# Patient Record
Sex: Male | Born: 1990 | Race: White | Hispanic: No | Marital: Single | Smoking: Former smoker
Health system: Southern US, Community
[De-identification: ages and names within clinical notes are randomized; demographics above are authoritative.]

## PROBLEM LIST (undated history)

## (undated) DIAGNOSIS — J852 Abscess of lung without pneumonia: Secondary | ICD-10-CM

## (undated) DIAGNOSIS — F1910 Other psychoactive substance abuse, uncomplicated: Secondary | ICD-10-CM

## (undated) HISTORY — PX: OTHER SURGICAL HISTORY: SHX169

## (undated) HISTORY — DX: Abscess of lung without pneumonia: J85.2

## (undated) HISTORY — DX: Other psychoactive substance abuse, uncomplicated: F19.10

---

## 2011-01-21 ENCOUNTER — Ambulatory Visit (INDEPENDENT_AMBULATORY_CARE_PROVIDER_SITE_OTHER): Payer: 59

## 2011-01-21 ENCOUNTER — Ambulatory Visit (HOSPITAL_COMMUNITY): Admission: RE | Admit: 2011-01-21 | Payer: 59 | Source: Ambulatory Visit

## 2011-01-21 ENCOUNTER — Inpatient Hospital Stay (HOSPITAL_COMMUNITY)
Admission: EM | Admit: 2011-01-21 | Discharge: 2011-01-26 | DRG: 177 | Disposition: A | Discharge status: Home Health | Payer: 59 | Attending: Internal Medicine | Admitting: Internal Medicine

## 2011-01-21 ENCOUNTER — Inpatient Hospital Stay (INDEPENDENT_AMBULATORY_CARE_PROVIDER_SITE_OTHER)
Admission: RE | Admit: 2011-01-21 | Discharge: 2011-01-21 | Disposition: A | Discharge status: Home / Self Care | Payer: 59 | Source: Ambulatory Visit | Attending: Family Medicine | Admitting: Family Medicine

## 2011-01-21 DIAGNOSIS — J189 Pneumonia, unspecified organism: Secondary | ICD-10-CM

## 2011-01-21 DIAGNOSIS — F1210 Cannabis abuse, uncomplicated: Secondary | ICD-10-CM | POA: Diagnosis present

## 2011-01-21 DIAGNOSIS — Z79899 Other long term (current) drug therapy: Secondary | ICD-10-CM

## 2011-01-21 DIAGNOSIS — J852 Abscess of lung without pneumonia: Principal | ICD-10-CM | POA: Diagnosis present

## 2011-01-21 DIAGNOSIS — F17200 Nicotine dependence, unspecified, uncomplicated: Secondary | ICD-10-CM | POA: Diagnosis present

## 2011-01-21 LAB — BASIC METABOLIC PANEL
Chloride: 95 mEq/L — ABNORMAL LOW (ref 96–112)
Creatinine, Ser: 0.76 mg/dL (ref 0.4–1.5)
GFR calc Af Amer: >60 mL/min (ref >60)
Sodium: 133 mEq/L — ABNORMAL LOW (ref 135–145)

## 2011-01-21 LAB — CBC
MCV: 91.4 fL (ref 78.0–100.0)
Platelets: 408 10*3/uL — ABNORMAL HIGH (ref 150–400)
RBC: 3.84 MIL/uL — ABNORMAL LOW (ref 4.22–5.81)
RDW: 12.8 % (ref 11.5–15.5)
WBC: 20.8 10*3/uL — ABNORMAL HIGH (ref 4.0–10.5)

## 2011-01-21 LAB — DIFFERENTIAL
Basophils Absolute: 0 10*3/uL (ref 0.0–0.1)
Eosinophils Absolute: 0.2 10*3/uL (ref 0.0–0.7)
Lymphocytes Relative: 9 % — ABNORMAL LOW (ref 12–46)
Monocytes Absolute: 2.1 10*3/uL — ABNORMAL HIGH (ref 0.1–1.0)
Neutrophils Relative %: 80 % — ABNORMAL HIGH (ref 43–77)

## 2011-01-21 LAB — HEPATIC FUNCTION PANEL
AST: 8 U/L (ref 0–37)
Bilirubin, Direct: 0.1 mg/dL (ref 0.0–0.3)
Indirect Bilirubin: 0.2 mg/dL — ABNORMAL LOW (ref 0.3–0.9)
Total Protein: 6.7 g/dL (ref 6.0–8.3)

## 2011-01-21 LAB — URINALYSIS, ROUTINE W REFLEX MICROSCOPIC
Ketones, ur: NEGATIVE mg/dL
Leukocytes, UA: NEGATIVE
Nitrite: NEGATIVE
Protein, ur: NEGATIVE mg/dL
pH: 7 (ref 5.0–8.0)

## 2011-01-21 LAB — RAPID URINE DRUG SCREEN, HOSP PERFORMED
Benzodiazepines: NOT DETECTED
Cocaine: NOT DETECTED
Opiates: NOT DETECTED

## 2011-01-22 ENCOUNTER — Inpatient Hospital Stay (HOSPITAL_COMMUNITY): Payer: 59

## 2011-01-22 DIAGNOSIS — J852 Abscess of lung without pneumonia: Secondary | ICD-10-CM

## 2011-01-22 LAB — EXPECTORATED SPUTUM ASSESSMENT W GRAM STAIN, RFLX TO RESP C

## 2011-01-22 LAB — BASIC METABOLIC PANEL
BUN: 7 mg/dL (ref 6–23)
CO2: 30 mEq/L (ref 19–32)
Calcium: 8.5 mg/dL (ref 8.4–10.5)
Creatinine, Ser: 0.66 mg/dL (ref 0.4–1.5)
Glucose, Bld: 104 mg/dL — ABNORMAL HIGH (ref 70–99)

## 2011-01-22 LAB — POCT I-STAT, CHEM 8
Calcium, Ion: 1.08 mmol/L — ABNORMAL LOW (ref 1.12–1.32)
HCT: 39 % (ref 39.0–52.0)
Hemoglobin: 13.3 g/dL (ref 13.0–17.0)
Sodium: 134 mEq/L — ABNORMAL LOW (ref 135–145)
TCO2: 28 mmol/L (ref 0–100)

## 2011-01-22 LAB — CREATININE, SERUM
Creatinine, Ser: 0.79 mg/dL (ref 0.4–1.5)
GFR calc non Af Amer: >60 mL/min (ref >60)

## 2011-01-22 LAB — CBC
Hemoglobin: 11.9 g/dL — ABNORMAL LOW (ref 13.0–17.0)
MCH: 31.6 pg (ref 26.0–34.0)
MCV: 93.1 fL (ref 78.0–100.0)
RBC: 3.76 MIL/uL — ABNORMAL LOW (ref 4.22–5.81)

## 2011-01-22 MED ORDER — IOHEXOL 300 MG/ML  SOLN
80.0000 mL | Freq: Once | INTRAMUSCULAR | Status: AC | PRN
  Administered 2011-01-22: 80 mL via INTRAVENOUS

## 2011-01-23 DIAGNOSIS — J189 Pneumonia, unspecified organism: Secondary | ICD-10-CM

## 2011-01-23 DIAGNOSIS — J852 Abscess of lung without pneumonia: Secondary | ICD-10-CM

## 2011-01-23 LAB — CBC
MCH: 31.2 pg (ref 26.0–34.0)
MCHC: 33.5 g/dL (ref 30.0–36.0)
Platelets: 358 10*3/uL (ref 150–400)
RBC: 3.65 MIL/uL — ABNORMAL LOW (ref 4.22–5.81)

## 2011-01-23 LAB — CD4/CD8 (T-HELPER/T-SUPPRESSOR CELL)
CD8 T Cell Abs: 240 /uL (ref 230–1000)
Total lymphocyte count: 1470 /uL (ref 1000–4000)

## 2011-01-23 LAB — HEPATITIS PANEL, ACUTE
HCV Ab: NEGATIVE
Hep B C IgM: NEGATIVE
Hepatitis B Surface Ag: NEGATIVE

## 2011-01-23 LAB — BASIC METABOLIC PANEL
CO2: 28 mEq/L (ref 19–32)
Calcium: 8.6 mg/dL (ref 8.4–10.5)
GFR calc Af Amer: >60 mL/min (ref >60)
GFR calc non Af Amer: >60 mL/min (ref >60)
Sodium: 136 mEq/L (ref 135–145)

## 2011-01-23 LAB — IGG, IGA, IGM: IgM, Serum: 136 mg/dL (ref 41–251)

## 2011-01-24 ENCOUNTER — Inpatient Hospital Stay (HOSPITAL_COMMUNITY): Payer: 59

## 2011-01-24 DIAGNOSIS — J852 Abscess of lung without pneumonia: Secondary | ICD-10-CM

## 2011-01-24 LAB — CULTURE, RESPIRATORY W GRAM STAIN: Culture: NORMAL

## 2011-01-24 LAB — CBC
HCT: 34.2 % — ABNORMAL LOW (ref 39.0–52.0)
MCH: 31.3 pg (ref 26.0–34.0)
MCHC: 33.9 g/dL (ref 30.0–36.0)
RDW: 13 % (ref 11.5–15.5)

## 2011-01-24 LAB — BASIC METABOLIC PANEL
BUN: 6 mg/dL (ref 6–23)
Calcium: 8.6 mg/dL (ref 8.4–10.5)
GFR calc Af Amer: >60 mL/min (ref >60)
GFR calc non Af Amer: >60 mL/min (ref >60)
Potassium: 3.6 mEq/L (ref 3.5–5.1)

## 2011-01-24 LAB — ASPERGILLUS GALACTOMANNAN ANTIGEN: Aspergillus galactomannan Index: 0.1 (ref <0.5)

## 2011-01-24 NOTE — Consult Note (Signed)
  Gregory Flowers, Gregory Flowers NO.:  [id number]  MEDICAL RECORD NO.:  [id number]  LOCATION:  5114                         FACILITY:  [facility]  PHYSICIAN:  Ines Bloomer, M.D. DATE OF BIRTH:  1991-02-26  DATE OF CONSULTATION: DATE OF DISCHARGE:                                CONSULTATION   CHIEF COMPLAINT:  Fever.  HISTORY OF PRESENT ILLNESS:  This 20 year old [place] the beach.  He has history of multiple urine and sinus infections and was treated at an Outpatient Clinic for right lower lobe pneumonia.  He has temp of 103 and yellow sputum in cough.  He was admitted to the hospital because of progression in his temperature with chronic coughing.  PAST MEDICAL HISTORY:  Significant for extensive sinus infection, otitis media as a child.  ALLERGIES:  None.  MEDICATIONS:  None.  His white count on admission was 20,000.  FAMILY HISTORY:  Noncontributory.  SOCIAL HISTORY:  He apparently does use marijuana and smokes.  REVIEW OF SYSTEMS:  As reported in the chart.  PHYSICAL EXAMINATION:  GENERAL:  He is well-developed male with temperature of 101, blood pressure is 110/60, pulse is 100, sats are 95%. HEAD, EYES, EARS, NOSE, AND THROAT:  Unremarkable. NECK:  Supple without thyromegaly.  No rigidity. CHEST:  Decreased breath sounds on the right. HEART:  Regular sinus rhythm.  No murmurs. ABDOMEN:  Soft. EXTREMITIES:  Pulses are 2+.  There is no clubbing or edema. NEUROLOGIC:  He is oriented x3.  Sensory and motor intact.  Cranial nerves intact.  IMPRESSION: 1. Right lower lobe pneumonia with right lower lobe abscess. 2. History of multiple ear infections.  PLAN:  Serial chest x-rays, antibiotics.  He may need a bronchoscopy to obtain good culture.     Ines Bloomer, M.D.     DPB/MEDQ  D:  01/23/2011  T:  01/24/2011  Job:  [id number]  Electronically Signed by Jovita Gamma M.D. on 01/24/2011 04:56:54 PM

## 2011-01-25 ENCOUNTER — Inpatient Hospital Stay (HOSPITAL_COMMUNITY): Payer: 59

## 2011-01-25 LAB — BASIC METABOLIC PANEL
BUN: 10 mg/dL (ref 6–23)
CO2: 31 mEq/L (ref 19–32)
Chloride: 100 mEq/L (ref 96–112)
Glucose, Bld: 102 mg/dL — ABNORMAL HIGH (ref 70–99)
Potassium: 3.8 mEq/L (ref 3.5–5.1)

## 2011-01-25 LAB — CBC
HCT: 32.4 % — ABNORMAL LOW (ref 39.0–52.0)
Hemoglobin: 10.7 g/dL — ABNORMAL LOW (ref 13.0–17.0)
MCHC: 33 g/dL (ref 30.0–36.0)
RBC: 3.51 MIL/uL — ABNORMAL LOW (ref 4.22–5.81)
WBC: 14 10*3/uL — ABNORMAL HIGH (ref 4.0–10.5)

## 2011-01-25 LAB — VANCOMYCIN, TROUGH: Vancomycin Tr: 18.9 ug/mL (ref 10.0–20.0)

## 2011-01-26 LAB — BASIC METABOLIC PANEL
BUN: 8 mg/dL (ref 6–23)
Calcium: 8.7 mg/dL (ref 8.4–10.5)
GFR calc Af Amer: >60 mL/min (ref >60)
GFR calc non Af Amer: >60 mL/min (ref >60)
Potassium: 3.9 mEq/L (ref 3.5–5.1)
Sodium: 138 mEq/L (ref 135–145)

## 2011-01-26 LAB — CBC
MCH: 30.5 pg (ref 26.0–34.0)
MCHC: 32.8 g/dL (ref 30.0–36.0)
RDW: 12.9 % (ref 11.5–15.5)

## 2011-01-27 LAB — CULTURE, BLOOD (ROUTINE X 2)
Culture  Setup Time: 201206101651
Culture: NO GROWTH
Culture: NO GROWTH

## 2011-01-30 ENCOUNTER — Telehealth: Payer: Self-pay | Admitting: Licensed Clinical Social Worker

## 2011-01-30 ENCOUNTER — Other Ambulatory Visit: Payer: Self-pay | Admitting: Licensed Clinical Social Worker

## 2011-01-30 ENCOUNTER — Other Ambulatory Visit (INDEPENDENT_AMBULATORY_CARE_PROVIDER_SITE_OTHER): Payer: 59

## 2011-01-30 ENCOUNTER — Ambulatory Visit (HOSPITAL_COMMUNITY)
Admission: RE | Admit: 2011-01-30 | Discharge: 2011-01-30 | Disposition: A | Discharge status: Home / Self Care | Payer: 59 | Source: Ambulatory Visit | Attending: Infectious Disease | Admitting: Infectious Disease

## 2011-01-30 ENCOUNTER — Emergency Department (HOSPITAL_COMMUNITY)
Admission: EM | Admit: 2011-01-30 | Discharge: 2011-01-30 | Disposition: A | Discharge status: Home / Self Care | Payer: 59 | Attending: Emergency Medicine | Admitting: Emergency Medicine

## 2011-01-30 DIAGNOSIS — R002 Palpitations: Secondary | ICD-10-CM | POA: Insufficient documentation

## 2011-01-30 DIAGNOSIS — R509 Fever, unspecified: Secondary | ICD-10-CM

## 2011-01-30 DIAGNOSIS — J189 Pneumonia, unspecified organism: Secondary | ICD-10-CM

## 2011-01-30 DIAGNOSIS — R42 Dizziness and giddiness: Secondary | ICD-10-CM | POA: Insufficient documentation

## 2011-01-30 NOTE — Telephone Encounter (Signed)
Patient walked in today for blood cultures, patient started to feel light headed and dizzy along with vomiting as he was getting blood drawn. He became flushed and later turned very pale. Cold bath cloth was applied and he was placed in a comfortable position, he verbalized that he felt better, his blood pressure was taken it was 91/59 with pulse of 192. EMS was called but later cancelled because his blood pressure stabilized at 91/61 pulse 99. His mother that works at the hospital took him to the ER.

## 2011-01-30 NOTE — Telephone Encounter (Signed)
Patient's mother called stating the patient had a temp of 100.9 last night and it is currently 99.8. He is being treated with iv vanc and invanz. Spoke with Dr. Daiva Eves he would like the patient to come to the office and have blood cultures x2 and a chest xray. Patient aware and orders are in the  Computer.

## 2011-02-01 ENCOUNTER — Emergency Department (HOSPITAL_COMMUNITY): Payer: 59

## 2011-02-01 ENCOUNTER — Telehealth: Payer: Self-pay | Admitting: *Deleted

## 2011-02-01 ENCOUNTER — Emergency Department (HOSPITAL_COMMUNITY)
Admission: EM | Admit: 2011-02-01 | Discharge: 2011-02-01 | Disposition: A | Discharge status: Home / Self Care | Payer: 59 | Attending: Emergency Medicine | Admitting: Emergency Medicine

## 2011-02-01 DIAGNOSIS — R61 Generalized hyperhidrosis: Secondary | ICD-10-CM | POA: Insufficient documentation

## 2011-02-01 DIAGNOSIS — R509 Fever, unspecified: Secondary | ICD-10-CM | POA: Insufficient documentation

## 2011-02-01 DIAGNOSIS — Z79899 Other long term (current) drug therapy: Secondary | ICD-10-CM | POA: Insufficient documentation

## 2011-02-01 LAB — DIFFERENTIAL
Basophils Absolute: 0 10*3/uL (ref 0.0–0.1)
Basophils Relative: 0 % (ref 0–1)
Eosinophils Relative: 4 % (ref 0–5)
Monocytes Absolute: 0.3 10*3/uL (ref 0.1–1.0)

## 2011-02-01 LAB — CBC
HCT: 36 % — ABNORMAL LOW (ref 39.0–52.0)
MCHC: 33.3 g/dL (ref 30.0–36.0)
RDW: 13.1 % (ref 11.5–15.5)

## 2011-02-01 LAB — BASIC METABOLIC PANEL
BUN: 10 mg/dL (ref 6–23)
Calcium: 8.8 mg/dL (ref 8.4–10.5)
GFR calc Af Amer: >60 mL/min (ref >60)
GFR calc non Af Amer: >60 mL/min (ref >60)
Potassium: 3.7 mEq/L (ref 3.5–5.1)
Sodium: 135 mEq/L (ref 135–145)

## 2011-02-01 LAB — URINALYSIS, ROUTINE W REFLEX MICROSCOPIC
Bilirubin Urine: NEGATIVE
Ketones, ur: NEGATIVE mg/dL
Nitrite: NEGATIVE
Urobilinogen, UA: 0.2 mg/dL (ref 0.0–1.0)

## 2011-02-01 NOTE — Telephone Encounter (Signed)
States she will be out of vanc in 2 days & he is to be on it longer than that . Says the [facility] nurse comes out every Monday. Mom administers it. I will call [facility] & figure this out & call her back.

## 2011-02-01 NOTE — Telephone Encounter (Signed)
Patient's mother called today stating that her son has a fever on 103 with a bp of 112/53 pulse 112. Spoke with Dr. Zenaida Niece he agrees patient should go to the ER. Patient's mom would like a direct admit to [facility] on telemetry. Per the mother his heart rate has been very high over the past several days.

## 2011-02-02 NOTE — Progress Notes (Signed)
Subjective:    Patient ID: Gregory Flowers, male    DOB: July 18, 1991, 20 y.o.   MRN: [id number]  HPI  Gregory Flowers is a 20--year-old Caucasian male who was admitted to th b service earlier in June after he had failed oral antibiotics for what was thought to be bronchitis with ciprofloxacin and high-dose levofloxacin. He was found to have a cavitary right lower lobe concerning for a lung abscess. Cardiothoracic surgery were consulted and Dr. Edwyna Shell  did see the patient. He was changed to intravenous antibiotics in the form of vancomycin and Invanz. Patient was also seen by my partner Dr. Ninetta Lights. The patient initially improved on IV antibiotics alone it was felt that he would likely continue to improve with continued intravenous antibiotics. He was then discharged to home on vancomycin and her intravenous Invanz. Since then he has had troubles with high grade fevers up to 103. He's had nausea and malaise. He we have had multiple calls from his home health the knee and the patient has been seen twice in the ID clinic to check his IV and 2 to check surveillance blood cultures. On one occasion approximately a week ago he was tachycardic in the 190s and was sent from the clinic to the emergency department at that point time he was also felt to be an pale and uncomfortable appearing. He was seen ER where blood work was done repeat chest x-ray was done repeat chest x-ray shows stability of his infiltrate. He was then discharged to home. He again complained of high grade fevers and nausea on Friday and was referred by her clinic to the emergency room. He again seen in the emergency department again had blood work done blood cultures done which have been sterile so far. Chest x-ray shows stability of the lung abscess. The patient has wondered whether or not his fevers could be due to vancomycin one of the antibiotics he is receiving he does have flushing and what sound like the red man syndrome with vancomycin at times  feels feverish with it although his maximum temperature occurs in the middle of the day between and vancomycin doses. He has as mentioned other symptoms of nausea malaise and sweats this morning. Patient came in today for followup visit. As mentioned he had profuse sweating this morning but was without fever. Has felt slightly better in the last 24 hours. I have been concerned that he may have worsening of his lung abscess and that he needs imaging with a CT scan to better define this and see whether or not he may need intervention of Dr. Edwyna Shell. I will therefore bring him into the hospital electively to get a CT scan of the lungs and to facilitate a prompt cardiothoracic surgery consultation. In all I spent greater than 45 minutes with the patient including greater than 50% of time in counseling the patient and coordinating his care.  Review of Systems  Constitutional: Positive for fever, chills, diaphoresis and fatigue.  HENT: Negative for facial swelling and neck pain.   Eyes: Negative for photophobia and visual disturbance.  Respiratory: Positive for cough. Negative for chest tightness and wheezing.   Cardiovascular: Negative for chest pain, palpitations and leg swelling.  Gastrointestinal: Positive for nausea and vomiting. Negative for abdominal pain, diarrhea and abdominal distention.  Genitourinary: Negative for dysuria and flank pain.  Musculoskeletal: Negative for myalgias, back pain, arthralgias and gait problem.  Skin: Negative for color change and pallor.  Neurological: Negative for dizziness, facial asymmetry, light-headedness and headaches.  Hematological: Negative for adenopathy. Does not bruise/bleed easily.  Psychiatric/Behavioral: Negative for behavioral problems and agitation.       Objective:   Physical Exam  Constitutional: No distress.  HENT:  Head: Normocephalic and atraumatic.  Nose: Nose normal.  Mouth/Throat: Oropharynx is clear and moist. No oropharyngeal exudate.    Eyes: Conjunctivae and EOM are normal. Pupils are equal, round, and reactive to light. Right eye exhibits no discharge. No scleral icterus.  Neck: Normal range of motion. No JVD present.  Cardiovascular: Normal rate, regular rhythm and normal heart sounds.  Exam reveals no gallop and no friction rub.   No murmur heard. Pulmonary/Chest: No accessory muscle usage. Not tachypneic and not bradypneic. No respiratory distress. He has decreased breath sounds in the right lower field. He has no wheezes.       Egophany in right lung base  Lymphadenopathy:    He has no cervical adenopathy.  Skin: He is not diaphoretic.          Assessment & Plan:  Fever I am concerned that this fever is due to a lung abscess that is in need of intervention from a cardiothoracic surgeon. His blood cultures have been clean and his PICC line does not appear grossly infected. Certainly a drug fever is a possibility either due to vancomycin or due to beta-lactam. However I would first want to make sure that the and lung abscess worsening is not the issue and that is of greater concern at this point in time. Should his lung abscess appear to have improved one could consider changing his antibiotics to for example tigecycline which would cover methicillin-resistant staph aureus strep coccus species gram negatives and anaerobes and provide similar coverage to the vancomycin and ertapenem he is currently receiving.  Lung abscess Again I worry that the patient may have worsening of his lung abscess given his systemic symptoms of fever malaise diaphoresis and nausea. I will be admitted to the hospital and get a CT scan and also have him seen by Dr. Edwyna Shell.  Tachycardia Patient became tachycardic when we checked his pulse oximetry level today we think is likely due to stress we will put him on telemetry while he isan inpatient.

## 2011-02-05 ENCOUNTER — Encounter: Payer: Self-pay | Admitting: Infectious Disease

## 2011-02-05 ENCOUNTER — Inpatient Hospital Stay (HOSPITAL_COMMUNITY)
Admission: AD | Admit: 2011-02-05 | Discharge: 2011-02-06 | DRG: 179 | Disposition: A | Discharge status: Home / Self Care | Payer: 59 | Source: Ambulatory Visit | Attending: Internal Medicine | Admitting: Internal Medicine

## 2011-02-05 ENCOUNTER — Ambulatory Visit (INDEPENDENT_AMBULATORY_CARE_PROVIDER_SITE_OTHER): Payer: 59 | Admitting: Infectious Disease

## 2011-02-05 ENCOUNTER — Inpatient Hospital Stay (HOSPITAL_COMMUNITY): Payer: 59

## 2011-02-05 ENCOUNTER — Encounter: Payer: Self-pay | Admitting: Internal Medicine

## 2011-02-05 DIAGNOSIS — J852 Abscess of lung without pneumonia: Secondary | ICD-10-CM

## 2011-02-05 DIAGNOSIS — R509 Fever, unspecified: Secondary | ICD-10-CM | POA: Insufficient documentation

## 2011-02-05 DIAGNOSIS — R000 Tachycardia, unspecified: Secondary | ICD-10-CM | POA: Insufficient documentation

## 2011-02-05 DIAGNOSIS — I10 Essential (primary) hypertension: Secondary | ICD-10-CM | POA: Diagnosis present

## 2011-02-05 DIAGNOSIS — D649 Anemia, unspecified: Secondary | ICD-10-CM | POA: Diagnosis present

## 2011-02-05 LAB — COMPREHENSIVE METABOLIC PANEL
ALT: 45 U/L (ref 0–53)
AST: 37 U/L (ref 0–37)
Albumin: 2.5 g/dL — ABNORMAL LOW (ref 3.5–5.2)
Calcium: 8 mg/dL — ABNORMAL LOW (ref 8.4–10.5)
Creatinine, Ser: 0.72 mg/dL (ref 0.50–1.35)
Sodium: 135 mEq/L (ref 135–145)
Total Protein: 6.7 g/dL (ref 6.0–8.3)

## 2011-02-05 LAB — CBC
MCH: 30.5 pg (ref 26.0–34.0)
MCHC: 34.3 g/dL (ref 30.0–36.0)
Platelets: 144 10*3/uL — ABNORMAL LOW (ref 150–400)
RBC: 3.61 MIL/uL — ABNORMAL LOW (ref 4.22–5.81)
RDW: 13 % (ref 11.5–15.5)

## 2011-02-05 LAB — VANCOMYCIN, TROUGH: Vancomycin Tr: 9.9 ug/mL — ABNORMAL LOW (ref 10.0–20.0)

## 2011-02-05 MED ORDER — IOHEXOL 300 MG/ML  SOLN
80.0000 mL | Freq: Once | INTRAMUSCULAR | Status: AC | PRN
  Administered 2011-02-05: 80 mL via INTRAVENOUS

## 2011-02-05 NOTE — H&P (Deleted)
Hospital Admission Note Date: 02/05/2011  Patient name: Gregory Flowers Medical record number: [id number] Date of birth: 09/24/1990 Age: 20 y.o. Gender: male PCP: No primary provider on file.  Medical Service:  Attending physician:    Dr. Blanch Media Resident [phone]):    Dr. Eben Burow [phone] Resident (R1):    Dr. Anselm Jungling [phone]  Chief Complaint: persistent fever  History of Present Illness: Gregory Flowers is a 60--year-old Caucasian male who was recently admitted in June after he had failed oral antibiotics for what was thought to be bronchitis with ciprofloxacin and high-dose levofloxacin. He was found to have a cavitary right lower lobe concerning for a lung abscess. Cardiothoracic surgery were consulted and Dr. Edwyna Shell did see the patient. He was changed to intravenous antibiotics in the form of vancomycin and Invanz. The patient initially improved on IV antibiotics and was discharged to home on vancomycin and Invanz. Since then he has had high grade fevers up to 103 associated with nausea and malaise. He went to ED approximately one week ago for same concerns and was found to be tachycardic in 190's and repeat CXR showed stability of the infiltrate. He was discharged home but continued to experience the high grade fevers. Blood work and cultures were collected and have been negative to date. Pt describes fevers as intermittent and associated with flushing, malaise and nausea, sweats.  MEDICATIONS:   1. Acetaminophen 325 mg p.o. q.6 p.r.n.   2. Vicodin 5/325 mg 1 tablet p.o. q.4 p.r.n.   3. Ertapenem 1 g IV daily.   4. Vancomycin 1250 mg IV q.8 hours.   Allergies: Amoxicillin and Augmentin  PAST MEDICAL HISTORY: 1. Lung Abscess - recent admission on 01/2011 2. Substance abuse - THC, tobacco  History   Social History  . Marital Status: Single   Social History Main Topics  . Smoking status: Former Games developer  . Alcohol Use: Denies  . Drug Use: Denies   Review of  Systems:  Constitutional: Positive for fever, chills, diaphoresis and fatigue.  HENT: Negative for facial swelling and neck pain.  Eyes: Negative for photophobia and visual disturbance.  Respiratory: Positive for cough. Negative for chest tightness and wheezing.  Cardiovascular: Negative for chest pain, palpitations and leg swelling.  Gastrointestinal: Positive for nausea and vomiting. Negative for abdominal pain, diarrhea and abdominal distention.  Genitourinary: Negative for dysuria and flank pain.  Musculoskeletal: Negative for myalgias, back pain, arthralgias and gait problem.  Skin: Negative for color change and pallor.  Neurological: Negative for dizziness, facial asymmetry, light-headedness and headaches.  Hematological: Negative for adenopathy. Does not bruise/bleed easily.  Psychiatric/Behavioral: Negative for behavioral problems and agitation.   Physical Exam:  Constitutional: No distress, follows commands appropriately Head: Normocephalic and atraumatic.  Nose: Nose normal.  Mouth/Throat: Oropharynx is clear and moist. No oropharyngeal exudate.  Eyes: Conjunctivae and EOM are normal. Pupils are equal, round, and reactive to light. Right eye exhibits no discharge. No scleral icterus.  Neck: Normal range of motion. No JVD present. Supple. Cardiovascular: Normal rate, regular rhythm and normal heart sounds. Exam reveals no gallop and no friction rub. No murmur heard.  Pulmonary/Chest: No accessory muscle usage. Not tachypneic and not bradypneic. No respiratory distress. He has decreased breath sounds in the right lower field. He has no wheezes.  Egophany in right lung base  Lymphadenopathy: He has no cervical adenopathy.  Skin: He is not diaphoretic.   Lab results: pending  Imaging results: pending  Assessment & Plan by Problem:  1) Persistent fevers -concerning  for worsening lung abscess vs drug related fevers. There may be a need from a cardiothoracic surgeon. Plan is to  obtain CT of the chest with contrast and if the abscess appears to be improvingantibiotics can be switched and perhaps consider tigecycline. PLAN: - admit to telemetry and monitor vitals - initiate NS at 100 cc/hr and encourage PO intake - obtain CT of the chest with contrast to evaluate abscess - continue abx vancomycin and ertapenem and readjust the regimen as indicated based on CT chest - consider cardiothoracic surgery if abscess worsening  2) Tachycardia - mostly secondary to problem #1 PLAN: - monitor on telemetry - initiate IV fluids and encourage PO intake - no need for blood cultures, since obtained several days ago and so far negative - follow up on admission labs  3) DVT prophylaxis - heparin 5000 units Q8 hours  [employer]

## 2011-02-05 NOTE — H&P (Signed)
error 

## 2011-02-05 NOTE — Assessment & Plan Note (Signed)
Again I worry that the patient may have worsening of his lung abscess given his systemic symptoms of fever malaise diaphoresis and nausea. I will be admitted to the hospital and get a CT scan and also have him seen by Dr. Edwyna Shell.

## 2011-02-05 NOTE — Assessment & Plan Note (Signed)
I am concerned that this fever is due to a lung abscess that is in need of intervention from a cardiothoracic surgeon. His blood cultures have been clean and his PICC line does not appear grossly infected. Certainly a drug fever is a possibility either due to vancomycin or due to beta-lactam. However I would first want to make sure that the and lung abscess worsening is not the issue and that is of greater concern at this point in time. Should his lung abscess appear to have improved one could consider changing his antibiotics to for example tigecycline which would cover methicillin-resistant staph aureus strep coccus species gram negatives and anaerobes and provide similar coverage to the vancomycin and ertapenem he is currently receiving.

## 2011-02-05 NOTE — Assessment & Plan Note (Signed)
Patient became tachycardic when we checked his pulse oximetry level today we think is likely due to stress we will put him on telemetry while he isan inpatient.

## 2011-02-06 DIAGNOSIS — J852 Abscess of lung without pneumonia: Secondary | ICD-10-CM

## 2011-02-06 LAB — FERRITIN: Ferritin: 590 ng/mL — ABNORMAL HIGH (ref 22–322)

## 2011-02-06 LAB — CBC
HCT: 32.9 % — ABNORMAL LOW (ref 39.0–52.0)
MCV: 89.2 fL (ref 78.0–100.0)
RBC: 3.69 MIL/uL — ABNORMAL LOW (ref 4.22–5.81)
WBC: 3.1 10*3/uL — ABNORMAL LOW (ref 4.0–10.5)

## 2011-02-06 LAB — IRON AND TIBC
Iron: 33 ug/dL — ABNORMAL LOW (ref 42–135)
Saturation Ratios: 18 % — ABNORMAL LOW (ref 20–55)
UIBC: 152 ug/dL

## 2011-02-06 LAB — BASIC METABOLIC PANEL
BUN: 6 mg/dL (ref 6–23)
CO2: 26 mEq/L (ref 19–32)
Chloride: 102 mEq/L (ref 96–112)
Creatinine, Ser: 0.69 mg/dL (ref 0.50–1.35)
GFR calc Af Amer: >60 mL/min (ref >60)
Potassium: 4 mEq/L (ref 3.5–5.1)

## 2011-02-06 LAB — FOLATE: Folate: 10.8 ng/mL

## 2011-02-08 LAB — CULTURE, BLOOD (ROUTINE X 2)
Culture  Setup Time: 201206220218
Culture: NO GROWTH

## 2011-02-12 ENCOUNTER — Telehealth: Payer: Self-pay | Admitting: *Deleted

## 2011-02-12 NOTE — Telephone Encounter (Signed)
Mom states he has a red rash over "85% of his body". Does not itch. Does not look like blisters. He is taking benadryl. appt made for tomorrow at [facility]

## 2011-02-13 ENCOUNTER — Encounter: Payer: Self-pay | Admitting: Adult Health

## 2011-02-13 ENCOUNTER — Ambulatory Visit (INDEPENDENT_AMBULATORY_CARE_PROVIDER_SITE_OTHER): Payer: 59 | Admitting: Adult Health

## 2011-02-13 DIAGNOSIS — J852 Abscess of lung without pneumonia: Secondary | ICD-10-CM

## 2011-02-13 DIAGNOSIS — Z888 Allergy status to other drugs, medicaments and biological substances status: Secondary | ICD-10-CM

## 2011-02-13 DIAGNOSIS — T7840XA Allergy, unspecified, initial encounter: Secondary | ICD-10-CM

## 2011-02-13 DIAGNOSIS — R110 Nausea: Secondary | ICD-10-CM | POA: Insufficient documentation

## 2011-02-13 MED ORDER — METHYLPREDNISOLONE 4 MG PO KIT: PACK | ORAL | Status: AC

## 2011-02-13 MED ORDER — ONDANSETRON HCL 4 MG PO TABS: 4.0000 mg | ORAL_TABLET | Freq: Three times a day (TID) | ORAL | Status: AC | PRN

## 2011-02-13 NOTE — Progress Notes (Signed)
  Subjective:    Patient ID: Gregory Flowers, male    DOB: 1991/03/11, 20 y.o.   MRN: [id number]  HPI 20 year old, white male with a history of lung abscess and currently, on IV vancomycin and Invanz presents with a four-day onset of a diffuse urticaria, rash over most body areas, including, inguinal areas. Legs, arms, abdomen, and back. Has been on IV Vanco and Invanz for a little over 2 weeks and is currently scheduled for an additional 2 weeks. Therapy. Rash. Currently described as pruritic in nature, red, and has a sunburn-like sensation. Denies any new onset of fevers, chills, or sweats. Has had a considerable amount of weight loss during his current course of illness. Denies any dysphagia, odynophagia, or oral sores.   Review of Systems  Constitutional: Positive for fatigue and unexpected weight change. Negative for fever, chills, diaphoresis, activity change and appetite change.  HENT: Negative for sore throat, mouth sores and trouble swallowing.   Respiratory: Positive for shortness of breath. Negative for cough, wheezing and stridor.   Cardiovascular: Negative for chest pain, palpitations and leg swelling.  Gastrointestinal: Positive for nausea. Negative for vomiting and diarrhea.  Genitourinary: Negative.   Musculoskeletal: Negative.   Skin: Positive for rash.  Neurological: Negative.   Hematological: Negative.        Objective:   Physical Exam  Constitutional: He is oriented to person, place, and time. He appears well-developed.       Underweight-appearing with a BMI of 16.26  HENT:  Head: Normocephalic and atraumatic.  Eyes: Conjunctivae and EOM are normal. Pupils are equal, round, and reactive to light.  Cardiovascular: Normal rate and regular rhythm.   Musculoskeletal: Normal range of motion.  Neurological: He is alert and oriented to person, place, and time. No cranial nerve deficit. He exhibits normal muscle tone. Coordination normal.  Skin:       For diffuse  urticaria/morbilliform rash noted to all body areas, except face, and neck.  Psychiatric: He has a normal mood and affect. His behavior is normal. Judgment and thought content normal.          Assessment & Plan:  1. Allergic, drug reaction. Most likely associated with current antibiotics, Invanz > vancomycin. In discussion with Dr. Algis Liming, we have opted to discontinue both vancomycin and Invanz, and begin Tygacil 100 mg IV piggyback x1 then 50 mg IV every 12 hours through 02/25/2011. We will also give him a Medrol Dosepak and for his nausea. We will give him ondansetron. 4 mg by mouth every 8 hours when necessary. Family instructed to give him ondansetron. 30 minutes before his Tygacil. They were also instructed to contact the clinic should the rash. Not improve or new symptoms develop. He is scheduled to followup with Dr. Algis Liming on 02/26/2011, and he is to keep that appointment.  Verbally acknowledged all information that was provided to him and agreed with plan of care.

## 2011-02-16 NOTE — Discharge Summary (Signed)
NAMENOELL, SHULAR NO.:  [id number]  MEDICAL RECORD NO.:  [id number]  LOCATION:  5114                         FACILITY:  [facility]  PHYSICIAN:  Ileana Roup, M.D.  DATE OF BIRTH:  May 28, 1991  DATE OF ADMISSION:  01/21/2011 DATE OF DISCHARGE:  01/26/2011                              DISCHARGE SUMMARY   PRIMARY CARE PHYSICIAN:  Della Goo, MD, [place], [place], Outpatient ID clinic at [facility].  DISCHARGE DIAGNOSES: 1. Lung abscess.  The patient is being on IV antibiotics for 6 days     and will go home with PICC line for continuing IV therapy at home. 2. Substance abuse.  The patient is counseled on cessation of daily     marijuana use as well as pack a day cigarette smoking.  DISCHARGE MEDICATIONS: 1. Acetaminophen 325 mg p.o. q.6 p.r.n. 2. Vicodin 5/325 mg 1 tablet p.o. q.4 p.r.n. 3. Ertapenem 1 g IV daily. 4. Vancomycin 1250 mg IV q.8 hours. 5. Also stop taking ibuprofen due to potential nephrotoxicity and     combination with vancomycin.  DISPOSITION AND FOLLOWUP:  The patient will follow up with Dr. Della Goo at [facility], [facility] on February 07, 2011, at 10:30.  This appointment is for the patient to establish primary care physician and for disposition, followup on the recent lung abscess in hospitalization. The patient will also follow up with [facility] on February 05, 2011, at 11:30 a.m. for follow up on chest x-ray vancomycin trough and response outpatient IV antibiotics therapy.  Lastly, the patient will follow up with Dr. Jovita Gamma, thoracic surgeon for followup after recent hospitalization.  PROCEDURES:  The chest x-ray on January 21, 2011, showed right lower lobe pneumonia with small right parapneumonic effusion.  A follow up chest x- ray on January 22, 2011, showed right lower lobe pneumonia, partial interval clearing.  It also demonstrated cavitary component  and an air fluid level, which was more prominent since there was a prior study, which prompted a CT scan of the chest with contrast in which a cavitary lesion that was peripherally located in the right lower lobe, which was surrounded by a pneumonia was found to be suspicious for a pulmonary abscess.  It is also noted that necrotizing pneumonia or aspiration pneumonitis could cause the appearance noted several small nodules in the right lung were also noted that may had been inflammatory markers of the infectious process ongoing in the lung, is also noted that other etiology such as vasculitis or tumor or possible to consider less likely.  Radiologist also mentioned since the lesion is relatively peripheral and its possibility would be a bronchopleural fistula with a loculated pleural abscess.  Followed chest x-ray on January 24, 2011, and January 25, 2011, showed slight decrease in posterior segment right lower lobe cavitary infiltrate and a final chest x-ray on January 25, 2011, showed that the cavitary infiltrate was of similar size as previous. There is no evidence of pneumothorax in the right-sided PICC line was present in the superior vena cava.  CONSULTATIONS:  Infectious Disease; Interventional Radiology; Thoracic Surgery.  BRIEF ADMITTING HISTORY AND PHYSICAL:  The patient is fully immunized 20- year-old male with past medical history of multiple ear and sinus infections as a child, who presents for a 2-week history of worsening right lower thorax chest pain, reports nearly 6 weeks ago, developed a sinus infection with symptoms including congestion, rhinorrhea, and cough due to postnasal drip.  He took over-the-counter cough and cold medications with little relief with symptoms.  On Jan 04, 2011, the patient went on a family vacation to beach, at which time he began to feel a pain in the lower right chest with associated with decreased energy.  Fever to a T-max to 103, fatigue, cough,  productive of yellow sputum, and shortness of breath, and returned from a beach, he went to an outpatient clinic in [place], [place] and received a 5-day course of azithromycin, which he did complete.  At the completion of this course of antibiotics, he returned in the clinic with continuing symptoms and worsening pain, and the chest x-ray was taken which showed a right lower lobe pneumonia.  He was then given a 5-day course of levofloxacin, which he also completed as well as a prednisone steroid taper and codeine cough syrup.  He finished the prescription of levofloxacin on January 16, 2011, with no resolution symptoms.  On January 16, 2011, to January 21, 2011, he reported being unable to lay flat to sleep, because of severe pain and he also reported continuing shortness of breath, cough, productive green sputum, pain in the right lower chest with 7-8 out 10 that worsened with cough as well as the fever.  He had been taking 800 mg of Ibuprofen once a day for symptomatic relief.  He reports associated decreased appetite and p.o. intake, fatigue and decreasing activity, headaches, chronic coughing as well significant sweating both during the day and the night.  Of note, his mother is a respiratory therapist at [employer], and both his mother and father have recently been with similar complaints of cough productive with yellow sputum.  However, both parents improved clinically with empiric treatment of azithromycin.  LABORATORY DATA:  On admission, white blood cell count 20.8, hemoglobin 12, hematocrit 35.1, platelet count 408, absolute neurophil count 16.6. Sodium 133, potassium 3.8, chloride 95, CO2 29, glucose 123, BUN 9, creatinine 0.76, calcium 8.2.  UA was negative.  HOSPITAL COURSE: 1. Lung abscess.  The patient was admitted with severe right lower     chest pain and cough productive of green sputum, fever, and     leukocytosis with neutrophilia and left shift and labs.  He  was     immediately started on broad-spectrum antibiotics, vanc and Zosyn     for suspected bacterial pneumonia that was now responsive to     macrolides or fluoroquinolone antibiotics in the outpatient     setting.  A chest x-ray revealed an air-fluid level in the right     lower lung, which prompted a CT scan with contrast.  This revealed     a 6 cm cavitary lesion that was thought to be either an abscess or     loculated pleural effusion or empyema.  After further     consideration, it was deemed that it was actually a long abscess.     Infectious Disease was consulted and the patient was placed on     isolation and studies were ordered for identification of     microorganism causing infection including TB, Aspergillosis, as     well as a workup for  any possible immunodeficiency such as IgG     deficient or hyper IgE syndrome.  These all proved to be negative.     Interventional radiology and thoracic surgery were consulted for     possible drain placement or surgical intervention respectively, but     both services opted to watch and wait and treat with antibiotics.     After 5 days of antibiotic treatment, a broad-spectrum antibiotics,     the patient's fever subsided his white count of 8.6, there was interval decrease in size of the cavitary lesion and chest x-ray,     and he improved clinically as well with less pain and less     coughing.  It was then decided to place a PICC line and defer     bronchoscopy in favor of home IV treatment of the lung infection     with broad-spectrum antibiotics for 3 additional weeks.  The     patient was discharged with vancomycin and ertapenem for 3 weeks of     IV treatment. 2. Substance abuse.  The patient was counseled on cessation of tobacco     and marijuana.  DISCHARGE LABS AND VITALS:  Vitals on January 26, 2011, at 6 a.m., temperature 97.5 degrees, pulse 89, respirations 18, systolic, blood pressure 123, diastolic 69, satting 100% on room  air.  Sodium 138, potassium 3.9, chloride 102, CO2 29, glucose 75, BUN 8, creatinine 0.77, calcium 8.7.  CBC revealed, white blood cell count 8.6, hemoglobin 10.7, hematocrit 32.6, platelet count 316.  Vancomycin trough on January 25, 2011, showed a therapeutic level for lung infection of 18.9. Other pertinent studies include a Indeterminate QuantiFERON-TB Gold assay, negative Aspergillus study, and negative acid fast bacteria culture and smear.     Bethel Born, MD   ______________________________ Ileana Roup, M.D.    MD/MEDQ  D:  01/26/2011  T:  01/27/2011  Job:  [id number]  cc:   Della Goo, M.D. Ines Bloomer, M.D.  Electronically Signed by Bethel Born  on 01/29/2011 10:38:48 PM Electronically Signed by Margarito Liner M.D. on 02/16/2011 02:34:54 PM

## 2011-02-19 ENCOUNTER — Other Ambulatory Visit: Payer: Self-pay | Admitting: Thoracic Surgery

## 2011-02-19 ENCOUNTER — Encounter: Payer: Self-pay | Admitting: Infectious Diseases

## 2011-02-19 DIAGNOSIS — C3430 Malignant neoplasm of lower lobe, unspecified bronchus or lung: Secondary | ICD-10-CM

## 2011-02-20 ENCOUNTER — Ambulatory Visit
Admission: RE | Admit: 2011-02-20 | Discharge: 2011-02-20 | Disposition: A | Discharge status: Home / Self Care | Payer: 59 | Source: Ambulatory Visit | Attending: Thoracic Surgery | Admitting: Thoracic Surgery

## 2011-02-20 ENCOUNTER — Ambulatory Visit (INDEPENDENT_AMBULATORY_CARE_PROVIDER_SITE_OTHER): Payer: 59 | Admitting: Thoracic Surgery

## 2011-02-20 DIAGNOSIS — J852 Abscess of lung without pneumonia: Secondary | ICD-10-CM

## 2011-02-20 DIAGNOSIS — C3430 Malignant neoplasm of lower lobe, unspecified bronchus or lung: Secondary | ICD-10-CM

## 2011-02-21 NOTE — Assessment & Plan Note (Signed)
OFFICE VISIT  Gregory Flowers, Gregory Flowers DOB:  01-18-1991                                        February 20, 2011 CHART #:  [id number]  The patient came today and his chest x-ray still showed some reaction to left costophrenic angle, but it is markedly improved and has not cough up any more blood.  He is going to get IV antibiotic for another 6 weeks and then for another 7 days and then pull his PICC line.  I will see him back in 6 weeks for the chest x-ray, but I believe his superior segmental right lower lobe abscess should hopefully have to be completely resolved at that time.  Ines Bloomer, M.D. Electronically Signed  DPB/MEDQ  D:  02/20/2011  T:  02/21/2011  Job:  [id number]  cc:   Della Goo, M.D. John L. Rendall, M.D.

## 2011-02-26 ENCOUNTER — Encounter: Payer: Self-pay | Admitting: Infectious Disease

## 2011-02-26 ENCOUNTER — Ambulatory Visit (INDEPENDENT_AMBULATORY_CARE_PROVIDER_SITE_OTHER): Payer: 59 | Admitting: Infectious Disease

## 2011-02-26 VITALS — BP 117/80 | HR 97 | Temp 98.1°F | Wt 139.0 lb

## 2011-02-26 DIAGNOSIS — D72829 Elevated white blood cell count, unspecified: Secondary | ICD-10-CM

## 2011-02-26 DIAGNOSIS — Z888 Allergy status to other drugs, medicaments and biological substances status: Secondary | ICD-10-CM

## 2011-02-26 DIAGNOSIS — T7840XA Allergy, unspecified, initial encounter: Secondary | ICD-10-CM

## 2011-02-26 DIAGNOSIS — D709 Neutropenia, unspecified: Secondary | ICD-10-CM

## 2011-02-26 DIAGNOSIS — J852 Abscess of lung without pneumonia: Secondary | ICD-10-CM

## 2011-02-26 LAB — BASIC METABOLIC PANEL
BUN: 24 mg/dL — ABNORMAL HIGH (ref 6–23)
Calcium: 8.9 mg/dL (ref 8.4–10.5)
Creat: 0.85 mg/dL (ref 0.50–1.35)
Glucose, Bld: 72 mg/dL (ref 70–99)
Potassium: 4.3 mEq/L (ref 3.5–5.3)

## 2011-02-26 MED ORDER — CLINDAMYCIN HCL 300 MG PO CAPS: 300.0000 mg | ORAL_CAPSULE | Freq: Three times a day (TID) | ORAL | Status: AC

## 2011-02-27 DIAGNOSIS — D709 Neutropenia, unspecified: Secondary | ICD-10-CM | POA: Insufficient documentation

## 2011-02-27 DIAGNOSIS — D72829 Elevated white blood cell count, unspecified: Secondary | ICD-10-CM | POA: Insufficient documentation

## 2011-02-27 LAB — CBC WITH DIFFERENTIAL/PLATELET
Basophils Relative: 0 % (ref 0–1)
Eosinophils Absolute: 1.2 10*3/uL — ABNORMAL HIGH (ref 0.0–0.7)
Eosinophils Relative: 8 % — ABNORMAL HIGH (ref 0–5)
HCT: 45.6 % (ref 39.0–52.0)
Hemoglobin: 14.6 g/dL (ref 13.0–17.0)
MCH: 29.9 pg (ref 26.0–34.0)
MCHC: 32 g/dL (ref 30.0–36.0)
MCV: 93.4 fL (ref 78.0–100.0)
Monocytes Absolute: 2.4 10*3/uL — ABNORMAL HIGH (ref 0.1–1.0)
Monocytes Relative: 16 % — ABNORMAL HIGH (ref 3–12)

## 2011-02-27 NOTE — Assessment & Plan Note (Signed)
Not clear if was due to vanco or beta lactam

## 2011-02-27 NOTE — Assessment & Plan Note (Signed)
May have been drug related (vanco or beta lactam) resolved

## 2011-02-27 NOTE — Progress Notes (Signed)
Subjective:    Patient ID: Gregory Flowers, male    DOB: [birth year], 20 y.o.   MRN: [id number]  HPI  Gregory Flowers is a 29--year-old Caucasian male who was admitted to th b service earlier in June after he had failed oral antibiotics for what was thought to be bronchitis with ciprofloxacin and high-dose levofloxacin. He was found to have a cavitary right lower lobe concerning for a lung abscess. Cardiothoracic surgery were consulted and Dr. Edwyna Shell did see the patient. He was changed to intravenous antibiotics in the form of vancomycin and Invanz. Patient was also seen by my partner Dr. Ninetta Lights. The patient initially improved on IV antibiotics alone it was felt that he would likely continue to improve with continued intravenous antibiotics. He was then discharged to home on vancomycin and her intravenous Invanz. Since then he has had troubles with high grade fevers up to 103, I admitted him to the hospital and CT scan showed dramatic decrease in size of lung abscess adn in house he was afebrile. There had been concern by pt that the fecvers might have been due to vancomycin. Of note several  Of his surveillance wbc had been into th 2 to 3 range with borderline neutropenia while on vanco and invanz. He subsequently developed an intense diffuse maculopapular rash and was seen by my NP Jeral Fruit and pt was taken off of vanco and invanz and changed to tygacil. He did well on this and has continued to have improvement in strength and energy He has been without fevers or malaise. His surveillaNCE WBc was now actually elevated above 13k. He specifically denies diarrhea Review of Systems  Constitutional: Negative for fever, chills, diaphoresis, activity change, appetite change, fatigue and unexpected weight change.  HENT: Negative for congestion, sore throat, rhinorrhea, sneezing, trouble swallowing and sinus pressure.   Eyes: Negative for photophobia and visual disturbance.  Respiratory: Negative for cough,  chest tightness, shortness of breath, wheezing and stridor.   Cardiovascular: Negative for chest pain, palpitations and leg swelling.  Gastrointestinal: Negative for nausea, vomiting, abdominal pain, diarrhea, constipation, blood in stool, abdominal distention and anal bleeding.  Genitourinary: Negative for dysuria, hematuria, flank pain and difficulty urinating.  Musculoskeletal: Negative for myalgias, back pain, joint swelling, arthralgias and gait problem.  Skin: Negative for color change, pallor, rash and wound.  Neurological: Negative for dizziness, tremors, weakness and light-headedness.  Hematological: Negative for adenopathy. Does not bruise/bleed easily.  Psychiatric/Behavioral: Negative for behavioral problems, confusion, sleep disturbance, dysphoric mood, decreased concentration and agitation.       Objective:   Physical Exam  Constitutional: He is oriented to person, place, and time. He appears well-developed and well-nourished. No distress.  HENT:  Head: Normocephalic and atraumatic.  Mouth/Throat: Oropharynx is clear and moist. No oropharyngeal exudate.  Eyes: Conjunctivae and EOM are normal. Pupils are equal, round, and reactive to light. No scleral icterus.  Neck: Normal range of motion. Neck supple. No JVD present.  Cardiovascular: Normal rate, regular rhythm and normal heart sounds.  Exam reveals no gallop and no friction rub.   No murmur heard. Pulmonary/Chest: Effort normal. No respiratory distress. He has decreased breath sounds in the right lower field. He has no wheezes. He has no rales. He exhibits no tenderness.  Abdominal: He exhibits no distension and no mass. There is no tenderness. There is no rebound and no guarding.  Musculoskeletal: He exhibits no edema and no tenderness.  Lymphadenopathy:    He has no cervical adenopathy.  Neurological: He is  alert and oriented to person, place, and time. He has normal reflexes. He exhibits normal muscle tone. Coordination  normal.  Skin: Skin is warm and dry. He is not diaphoretic. No erythema. No pallor.       PICC line cdi  Psychiatric: He has a normal mood and affect. His behavior is normal. Judgment and thought content normal.          Assessment & Plan:  Lung abscess Doing well. DC the IV tygacil and change to po clindamycin, rtc in 6 wks time with recheck cxr and revisit with Dr. Edwyna Shell  Neutropenia May have been drug related (vanco or beta lactam) resolved  Allergic drug reaction Not clear if was due to vanco or beta lactam  Leukocytosis Recheck wbc. Vigilance for cdiff esp while on clindamycin

## 2011-02-27 NOTE — Assessment & Plan Note (Signed)
Doing well. DC the IV tygacil and change to po clindamycin, rtc in 6 wks time with recheck cxr and revisit with Dr. Edwyna Shell

## 2011-02-27 NOTE — Assessment & Plan Note (Signed)
Recheck wbc. Vigilance for cdiff esp while on clindamycin

## 2011-03-05 ENCOUNTER — Encounter: Payer: Self-pay | Admitting: Infectious Disease

## 2011-03-06 LAB — AFB CULTURE WITH SMEAR (NOT AT ARMC)

## 2011-03-07 LAB — AFB CULTURE WITH SMEAR (NOT AT ARMC)

## 2011-03-08 LAB — AFB CULTURE WITH SMEAR (NOT AT ARMC): Acid Fast Smear: NONE SEEN

## 2011-03-15 NOTE — Discharge Summary (Signed)
NAMEBODEY, Gregory Flowers NO.:  [id number]  MEDICAL RECORD NO.:  [id number]  LOCATION:  3739                         FACILITY:  [facility]  PHYSICIAN:  Blanch Media, M.D.DATE OF BIRTH:  [birth year]  DATE OF ADMISSION:  02/05/2011 DATE OF DISCHARGE:  02/06/2011                              DISCHARGE SUMMARY   DISCHARGE DIAGNOSES: 1. Persistent fever, likely secondary to drug fever. 2. Lung abscess. 3. Anemia, unclear etiology.  DISCHARGE MEDICATIONS: 1. Tylenol 650 mg p.o. q.6 h p.r.n. 2. Vancomycin 1250 mg IV q.8 h x3 weeks with a stop date of February 25, 2011. 3. Ertapenem 1 g IV daily x3 weeks with a stop date of February 25, 2011. 4. Hydrocodone 5/325 mg 1 tablet q.4 h p.r.n. for pain (prescription     given to the patient at time of discharge, number tablet was 30).  DISPOSITION AND FOLLOWUP:  Gregory Flowers was discharged from [facility] on February 06, 2011 in stable improved condition.  His fever has resolved and stated that his short of breath is also improved.  He needs to continue to take vancomycin and ertapenem x3 weeks with a stop date of February 25, 2011 and will have a followup appointment with Dr. Daiva Eves during the second week of July.  The patient's mother will call and make appointment for the patient.  At that time, he will need to 1. Evaluate his lung abscess to ensure complete resolution of his     abscess.  At that time, Dr. Daiva Eves mentioned that he might stop     the patient on oral clindamycin after he completed the 4 weeks'     course of IV antibiotics. 2. Follow up on his anemia, the patient had a hemoglobin of 11,     therefore anemia panel was ordered; however, it was pending at the     time of discharge.  Please also follow up on his blood culture that     was drawn on February 01, 2011.  CONSULTATION:  None.  PROCEDURE PERFORMED:  CT chest on February 05, 2011 showed significant improvement in the right lower lobe consolidation and lung  abscess.  ADMISSION HISTORY:  Gregory Flowers is a 20 year old man who was recently admitted in June after he failed oral antibiotics for what was thought to be bronchitis and was treated with ciprofloxacin and high-dose levofloxacin.  He was found to have a cavitary right lower lobe concerning for lung abscess.  Cardiothoracic Surgery was consulted and Dr. Edwyna Shell did evaluate the patient.  In addition, Intervention Radiology was also consulted; however, both surgery and IR have decided to continue medical management at that time.  The patient was started on IV antibiotics with vancomycin and ertapenem.  The patient initially improved on IV antibiotics and was discharged home on vancomycin and ertapenem x3 weeks.  Since then, he has had high-grade fevers up to 103 Fahrenheit associated with nausea and malaise.  He went to the ED approximately a week prior to admission for similar concern and was found to be tachycardic in the 190s and a repeat chest x-ray shows stability of the infiltrate, therefore he was  discharged home.  He continued to experience high-grade fever at home which finally stopped several days prior to admission.  Blood cultures were collected and has been negative to date.  The patient described his fever as intermittent and associated with flushing, malaise, nausea, and sweat.  PHYSICAL EXAMINATION ON ADMISSION:  VITAL SIGNS:  Temperature 98.9, pulse 104, blood pressure 109/74, respiration 20, O2 sat 99% on room air. CONSTITUTIONAL:  On no distress, following command appropriately. HEAD:  Normocephalic, atraumatic. NOSE:  Normal. MOUTH, THROAT, OROPHARYNX:  Clear and moist.  No exudate or erythema. EYES/CONJUNCTIVAE:  Extraocular movements are normal.  Pupils equal, round, reactive to light and accommodation. NECK:  Normal range of motion.  No JVD present.  Supple. CARDIOVASCULAR:  Regular rate and rhythm.  S1 and S2, normal.  No murmur, gallop, or rub. PULMONARY:  No  accessory muscle use.  No tachypnea.  No respiratory distress.  He has decreased breath sounds in right lower field.  No wheezes.  Positive egophony in right lung base. LYMPHADENOPATHY.  No cervical adenopathy. NEUROLOGIC:  Cranial nerves II through XII grossly intact, motor strength 5/5 in all extremities, intact sensation to light touch, normal gait.  Alert and oriented x3.  ADMISSION LABORATORIES:  Sodium 135, potassium 3.8, chloride 100, bicarb 29, BUN 7, creatinine 0.72, glucose 108.  WBC 2.2, hemoglobin 11.2, hematocrit 32.1, platelet 144.  Alcohol level less than 11.  Vancomycin level 9.9.  Lactic acid 0.8, total bili 0.1, alk phos 62, AST 37, ALT 45, protein 6.7, albumin 2.5, calcium 8.0, corrected calcium 9.2.  HOSPITAL COURSE: 1. Persistent fever, likely drug-related fever.  The patient actually     did not spike any fever during hospital course.  Initially, it was     concerning for worsening of lung abscess, however, CT scan showed     significant improvement of his lung abscess.  Blood culture was     also drawn on February 01, 2011, when he presented to the ED, which     shows a preliminary report, no growth to date. 2. Lung abscess.  The patient received a CT scan of chest which shows     significant improvement of his lung abscess, and he continued to     report improvement in terms of his shortness of breath and remained     afebrile throughout hospital course.  We spoke to Dr. Daiva Eves and     he has recommended that the patient continue IV antibiotics     including vancomycin and ertapenem for a total of 4 weeks, and the     patient will have a followup appointment at the end of his IV     antibiotic course.  Dr. Daiva Eves is considering starting oral     clindamycin after his IV antibiotics to ensure complete resolution     of his lung abscess.  The patient will need repeat imaging either     chest x-ray or CT scan as an outpatient. 3. Anemia, unclear etiology at this  point.  The patient denies any     blood in the stool or in his urine.  No other sources of bleeding.     We ordered an anemia panel, which is pending at time of discharge.     His PCP, Dr. Lovell Sheehan or Dr. Daiva Eves will follow up when he comes     for office visit.  DISCHARGE VITALS:  Temperature 98.2, blood pressure 101/62, respirations 18, pulse 94, O2 sat 100%  on 2 liters nasal cannula.  DISCHARGE LABORATORIES:  WBC 3.1, hemoglobin 11.0, hematocrit 32.9, platelet 150.  Sodium 135, potassium 4.0, chloride 102, bicarb 26, BUN 6, creatinine 0.69, glucose 99, calcium 8.6.    ______________________________ Carrolyn Meiers, MD   ______________________________ Blanch Media, M.D.    MH/MEDQ  D:  02/08/2011  T:  02/09/2011  Job:  [id number]  cc:   Della Goo, M.D. Acey Lav, MD  Electronically Signed by Carrolyn Meiers MD on 02/26/2011 09:20:05 AM Electronically Signed by Blanch Media M.D. on 03/15/2011 10:30:58 AM

## 2011-03-26 ENCOUNTER — Encounter: Payer: Self-pay | Admitting: Infectious Disease

## 2011-03-26 ENCOUNTER — Encounter: Payer: Self-pay | Admitting: Infectious Diseases

## 2011-04-03 ENCOUNTER — Ambulatory Visit: Payer: 59 | Admitting: Thoracic Surgery

## 2011-04-10 ENCOUNTER — Ambulatory Visit: Payer: 59 | Admitting: Thoracic Surgery

## 2011-04-10 ENCOUNTER — Other Ambulatory Visit: Payer: Self-pay | Admitting: Thoracic Surgery

## 2011-04-10 DIAGNOSIS — C3430 Malignant neoplasm of lower lobe, unspecified bronchus or lung: Secondary | ICD-10-CM

## 2011-04-10 DIAGNOSIS — F1910 Other psychoactive substance abuse, uncomplicated: Secondary | ICD-10-CM | POA: Insufficient documentation

## 2011-04-11 ENCOUNTER — Ambulatory Visit: Payer: 59 | Admitting: Infectious Disease

## 2011-04-12 ENCOUNTER — Ambulatory Visit: Payer: 59 | Admitting: Thoracic Surgery

## 2011-04-12 ENCOUNTER — Ambulatory Visit (INDEPENDENT_AMBULATORY_CARE_PROVIDER_SITE_OTHER): Payer: 59 | Admitting: Thoracic Surgery

## 2011-04-12 ENCOUNTER — Ambulatory Visit
Admission: RE | Admit: 2011-04-12 | Discharge: 2011-04-12 | Disposition: A | Discharge status: Home / Self Care | Payer: 59 | Source: Ambulatory Visit | Attending: Thoracic Surgery | Admitting: Thoracic Surgery

## 2011-04-12 ENCOUNTER — Encounter: Payer: Self-pay | Admitting: Thoracic Surgery

## 2011-04-12 VITALS — BP 120/80 | HR 93 | Resp 16

## 2011-04-12 DIAGNOSIS — J852 Abscess of lung without pneumonia: Secondary | ICD-10-CM

## 2011-04-12 DIAGNOSIS — C3430 Malignant neoplasm of lower lobe, unspecified bronchus or lung: Secondary | ICD-10-CM

## 2011-04-12 NOTE — Progress Notes (Signed)
HPI the patient returns today for followup of his right lower lobe lung abscess. Chest x-ray today shows complete resolution of the lung abscess. He is being followed by infectious disease. I have released him back to his medical doctor I will see him again if he has any further pulmonary problems.  Current Outpatient Prescriptions  Medication Sig Dispense Refill  . clindamycin (CLEOCIN) 300 MG capsule Take 300 mg by mouth 3 (three) times daily.            Physical Exam  Cardiovascular: Normal rate and regular rhythm.   Pulmonary/Chest: Effort normal and breath sounds normal.      Diagnostic tests chest x-ray is normal.   Impression: Resolved right lower lobe lung abscess   Plan: Followup when necessary

## 2011-04-25 ENCOUNTER — Ambulatory Visit: Payer: 59 | Admitting: Infectious Disease

## 2011-05-08 ENCOUNTER — Ambulatory Visit: Payer: 59 | Admitting: Infectious Disease

## 2011-05-09 ENCOUNTER — Encounter: Payer: Self-pay | Admitting: Infectious Disease

## 2011-05-09 ENCOUNTER — Ambulatory Visit (INDEPENDENT_AMBULATORY_CARE_PROVIDER_SITE_OTHER): Payer: 59 | Admitting: Infectious Disease

## 2011-05-09 VITALS — BP 120/77 | HR 91 | Temp 98.1°F | Wt 153.0 lb

## 2011-05-09 DIAGNOSIS — J852 Abscess of lung without pneumonia: Secondary | ICD-10-CM

## 2011-05-09 DIAGNOSIS — F1910 Other psychoactive substance abuse, uncomplicated: Secondary | ICD-10-CM

## 2011-05-09 DIAGNOSIS — D72829 Elevated white blood cell count, unspecified: Secondary | ICD-10-CM

## 2011-05-09 LAB — BASIC METABOLIC PANEL WITH GFR
Calcium: 9.4 mg/dL (ref 8.4–10.5)
Chloride: 104 mEq/L (ref 96–112)
Creat: 1.09 mg/dL (ref 0.50–1.35)
GFR, Est Non African American: >60 mL/min (ref >60)

## 2011-05-09 LAB — CBC WITH DIFFERENTIAL/PLATELET
Basophils Absolute: 0 10*3/uL (ref 0.0–0.1)
Eosinophils Relative: 5 % (ref 0–5)
Lymphocytes Relative: 26 % (ref 12–46)
MCV: 93.3 fL (ref 78.0–100.0)
Neutrophils Relative %: 53 % (ref 43–77)
Platelets: 190 10*3/uL (ref 150–400)
RDW: 13.1 % (ref 11.5–15.5)
WBC: 4.8 10*3/uL (ref 4.0–10.5)

## 2011-05-09 MED ORDER — CHLORHEXIDINE GLUCONATE 4 % EX LIQD: Freq: Every day | CUTANEOUS | Status: DC | PRN

## 2011-05-09 MED ORDER — MUPIROCIN 2 % EX OINT: TOPICAL_OINTMENT | Freq: Two times a day (BID) | CUTANEOUS | Status: AC

## 2011-05-09 MED ORDER — CHLORHEXIDINE GLUCONATE 4 % EX LIQD: Freq: Every day | CUTANEOUS | Status: AC | PRN

## 2011-05-09 MED ORDER — MUPIROCIN 2 % EX OINT: TOPICAL_OINTMENT | Freq: Two times a day (BID) | CUTANEOUS | Status: DC

## 2011-05-09 NOTE — Assessment & Plan Note (Signed)
Resolved, dc clindamycin. Do hibiclenz and mupirocin given that there was high chance this was MSSA or MRSA

## 2011-05-09 NOTE — Progress Notes (Signed)
Subjective:    Patient ID: Gregory Flowers, male    DOB: 06/25/1991, 20 y.o.   MRN: [id number]  HPI  Gregory Flowers is a 20--year-old Caucasian male who was admitted to th b service earlier in June after he had failed oral antibiotics for what was thought to be bronchitis with ciprofloxacin and high-dose levofloxacin. He was found to have a cavitary right lower lobe concerning for a lung abscess. Cardiothoracic surgery were consulted and Dr. Edwyna Shell did see the patient. He was changed to intravenous antibiotics in the form of vancomycin and Invanz. Patient was also seen by my partner Dr. Ninetta Lights. The patient initially improved on IV antibiotics alone it was felt that he would likely continue to improve with continued intravenous antibiotics. He was then discharged to home on vancomycin and her intravenous Invanz. Since then he has had troubles with high grade fevers up to 103, I admitted him to the hospital and CT scan showed dramatic decrease in size of lung abscess adn in house he was afebrile. There had been concern by pt that the fecvers might have been due to vancomycin. Of note several Of his surveillance wbc had been into th 2 to 3 range with borderline neutropenia while on vanco and invanz. He subsequently developed an intense diffuse maculopapular rash and was seen by my NP Jeral Fruit and pt was taken off of vanco and invanz and changed to tygacil. He did well on this and has  And was changed to oral clindamycin. He has seen CVTS in followup and his CXR is now completely clear. He is withotu fevers chills, cough malaise, diarrhea. I encouraged him to stop smoking  Review of Systems  Constitutional: Negative for fever, chills, diaphoresis, activity change, appetite change, fatigue and unexpected weight change.  HENT: Negative for congestion, sore throat, rhinorrhea, sneezing, trouble swallowing and sinus pressure.   Eyes: Negative for photophobia and visual disturbance.  Respiratory: Negative for  cough, chest tightness, shortness of breath, wheezing and stridor.   Cardiovascular: Negative for chest pain, palpitations and leg swelling.  Gastrointestinal: Negative for nausea, vomiting, abdominal pain, diarrhea, constipation, blood in stool, abdominal distention and anal bleeding.  Genitourinary: Negative for dysuria, hematuria, flank pain and difficulty urinating.  Musculoskeletal: Negative for myalgias, back pain, joint swelling, arthralgias and gait problem.  Skin: Negative for color change, pallor, rash and wound.  Neurological: Negative for dizziness, tremors, weakness and light-headedness.  Hematological: Negative for adenopathy. Does not bruise/bleed easily.  Psychiatric/Behavioral: Negative for behavioral problems, confusion, sleep disturbance, dysphoric mood, decreased concentration and agitation.       Objective:   Physical Exam  Constitutional: He is oriented to person, place, and time. He appears well-developed and well-nourished. No distress.  HENT:  Head: Normocephalic and atraumatic.  Mouth/Throat: Oropharynx is clear and moist. No oropharyngeal exudate.  Eyes: Conjunctivae and EOM are normal. Pupils are equal, round, and reactive to light. No scleral icterus.  Neck: Normal range of motion. Neck supple. No JVD present.  Cardiovascular: Normal rate, regular rhythm and normal heart sounds.  Exam reveals no gallop and no friction rub.   No murmur heard. Pulmonary/Chest: Effort normal and breath sounds normal. No respiratory distress. He has no wheezes. He has no rales. He exhibits no tenderness.  Abdominal: He exhibits no distension and no mass. There is no tenderness. There is no rebound and no guarding.  Musculoskeletal: He exhibits no edema and no tenderness.  Lymphadenopathy:    He has no cervical adenopathy.  Neurological: He is  alert and oriented to person, place, and time. He has normal reflexes. He exhibits normal muscle tone. Coordination normal.  Skin: Skin is  warm and dry. He is not diaphoretic. No erythema. No pallor.  Psychiatric: He has a normal mood and affect. His behavior is normal. Judgment and thought content normal.          Assessment & Plan:  Lung abscess Resolved, dc clindamycin. Do hibiclenz and mupirocin given that there was high chance this was MSSA or MRSA  Leukocytosis Likely due to his severe cavitary pna. Will recheck  Substance abuse Asked to stop smoking tobacco

## 2011-05-09 NOTE — Assessment & Plan Note (Signed)
Likely due to his severe cavitary pna. Will recheck

## 2011-05-09 NOTE — Assessment & Plan Note (Signed)
Asked to stop smoking tobacco

## 2013-03-14 IMAGING — CT CT CHEST W/ CM
2 of 4 series · 15 of 36 positions shown, 18 images · IV contrast (APPLIED)
Comparison: 01/22/2011

CLINICAL DATA: Productive cough.  Pneumonia.  Possible cavitary
lesion.

CT CHEST WITH CONTRAST
TECHNIQUE: Multidetector CT imaging of the chest was performed
following the standard protocol during bolus administration of
intravenous contrast.
Contrast: 80 ml Kmnipaque-UXX

[Series 2: routine chest 5.0 st · axial · 0.58mm/px · z∈[-494,-149]mm · 12 of 81 slices shown, 15 images]
[im 6/81  mediastinal]
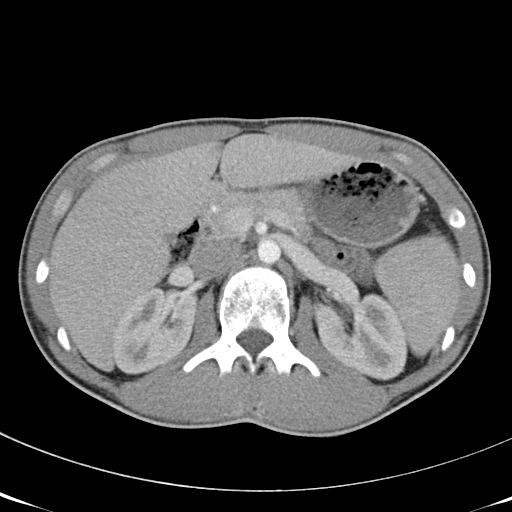
[im 6/81  lung]
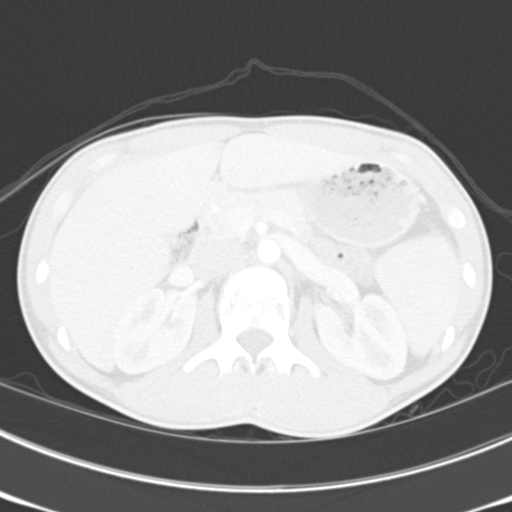
[im 11/81  lung]
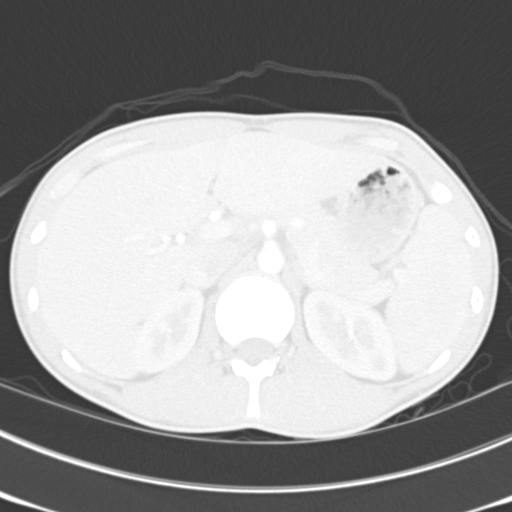
[im 17/81  lung]
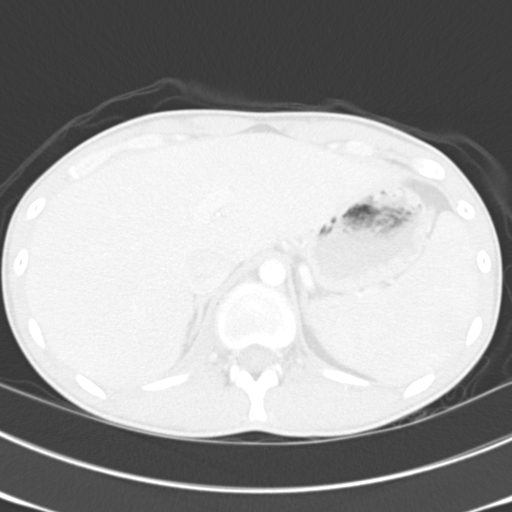
[im 27/81  lung]
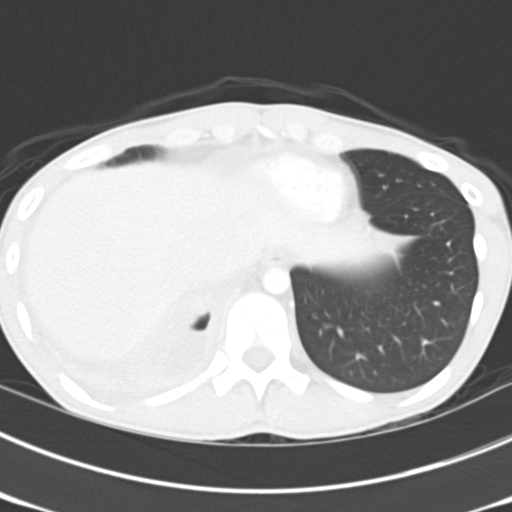
[im 33/81  mediastinal]
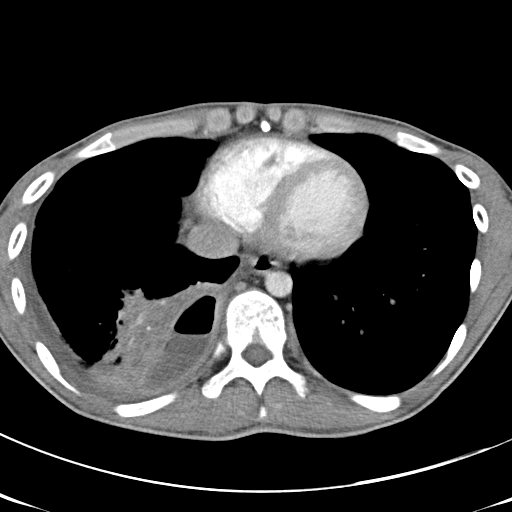
[im 33/81  lung]
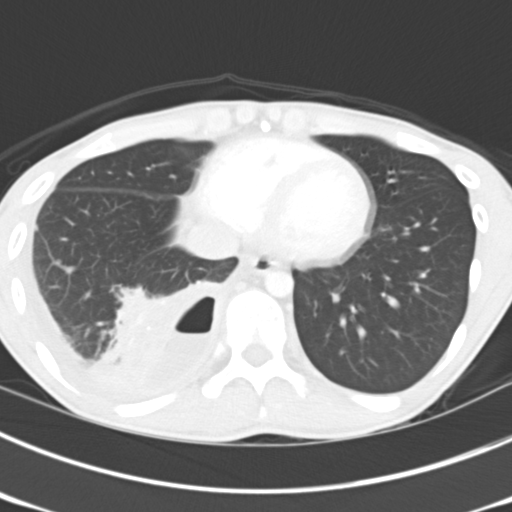
[im 38/81  lung]
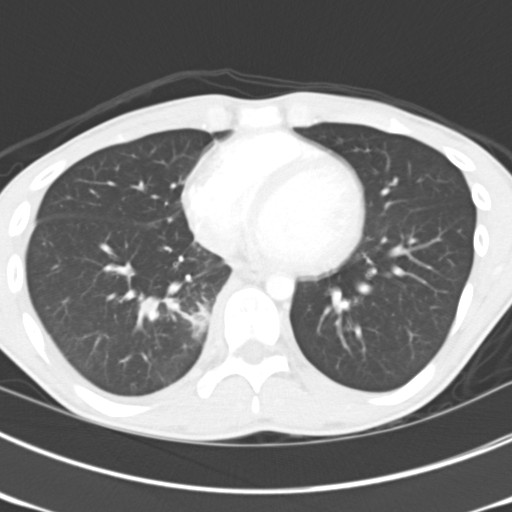
[im 43/81  lung]
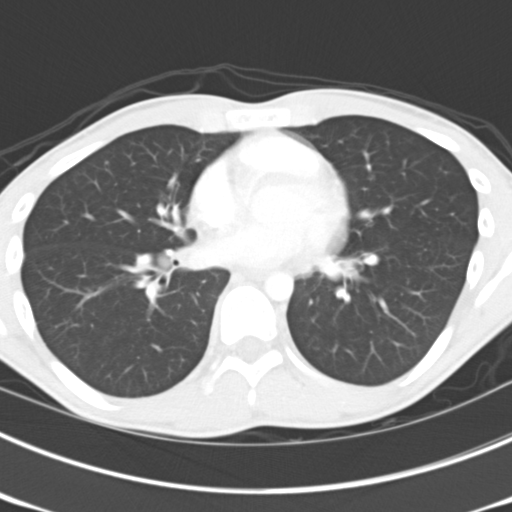
[im 49/81  lung]
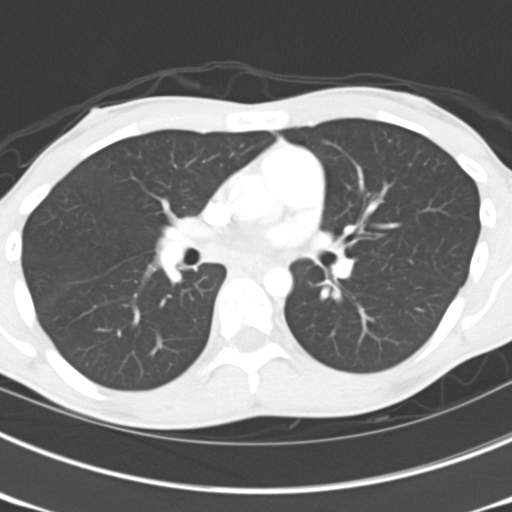
[im 54/81  mediastinal]
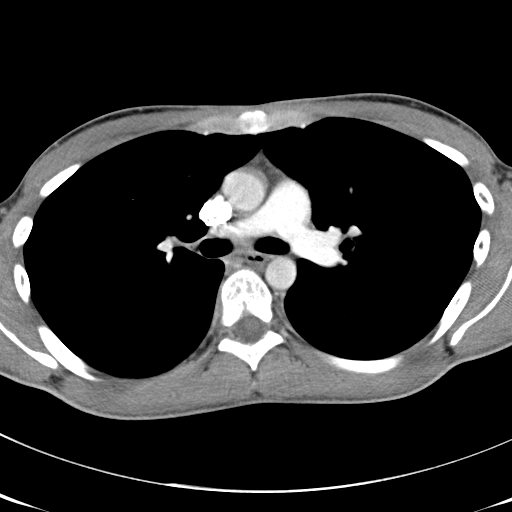
[im 54/81  lung]
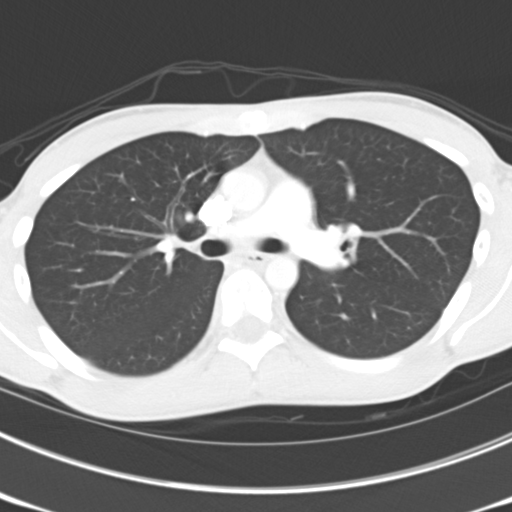
[im 65/81  lung]
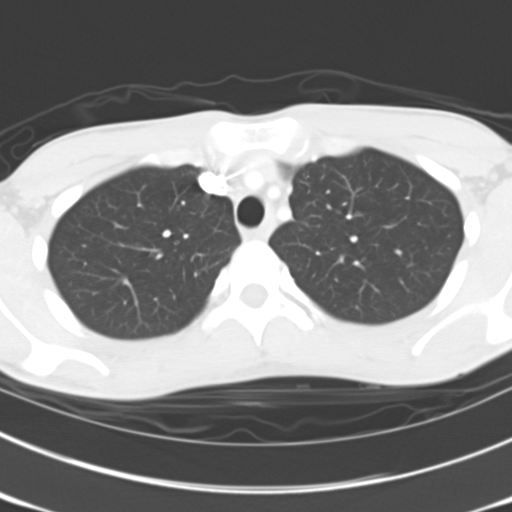
[im 70/81  lung]
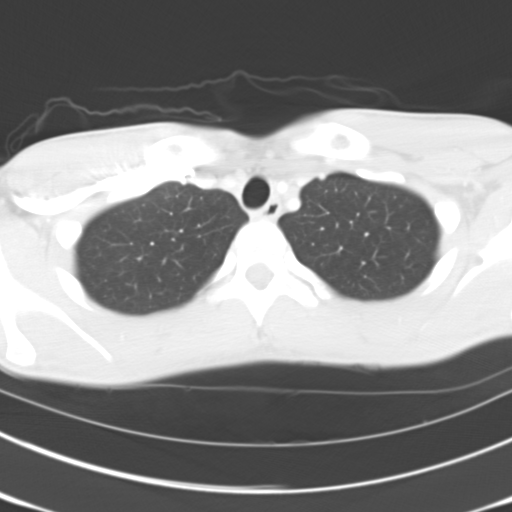
[im 75/81  lung]
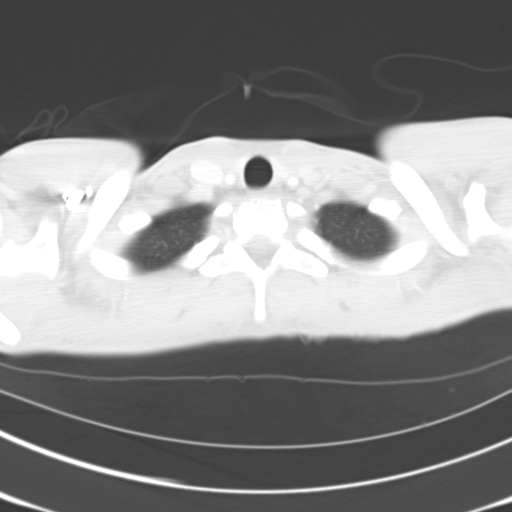

[Series 5: routine chest 2.0 st · coronal · 0.93mm/px · 3 of 98 slices shown]
[im 20/98  lung]
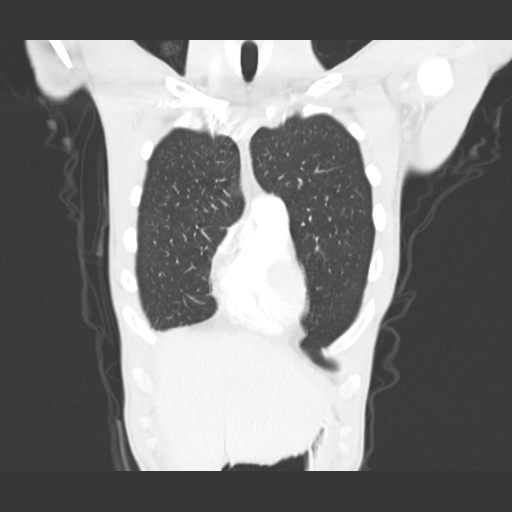
[im 39/98  lung]
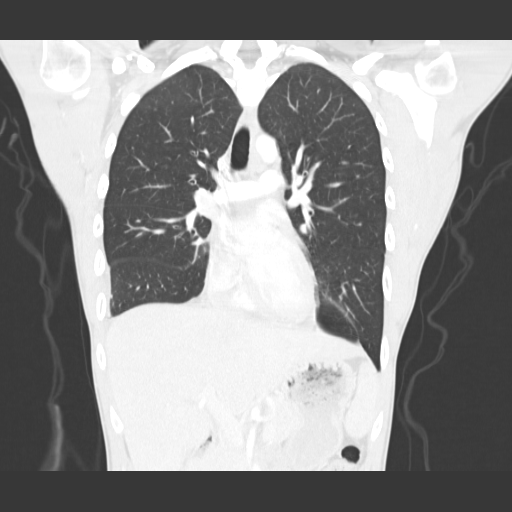
[im 59/98  lung]
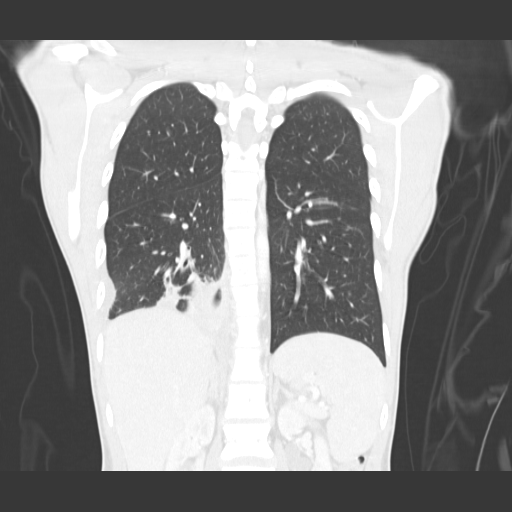

[15 of 36 positions shown; findings below may reference images not displayed]

FINDINGS: Pneumonia is present posteriorly in the right lower lobe,
along with what appears to be a peripheral cavitary pulmonary
parenchymal lesion with enhancing margins and internal air-fluid
level which may represent abscess or loculated hydropneumothorax.
This has enhancing margins.  The cavity measures 6.8 x 2.4 cm on
image 50 of series 2, not including the enhancing margins.

There also appears to be a small right pleural effusion, for
example on image 49 of series 2, with faint pleural enhancement
which may be an indicator of an exudative effusion.

Mild right hilar and infrahilar adenopathy noted, with the right
hilar node having a short axis diameter 1.0 cm.  An AP window node
likewise has a short axis diameter 1.0 cm.  Several small nodules
are present in the right lung, including a 0.5 x 0.6 cm nodule on
image 34 of series 3, and adjacent 3 mm nodule on the same image,
and a pleural based 4 mm nodule on image 40 of series 3.
IMPRESSION: 1.  Cavitary lesion peripherally in the right lower lobe with
marginal enhancement, internal air-fluid level, and surrounding
pneumonia, suspicious for a pulmonary abscess. Necrotizing
pneumonia or aspiration pneumonitis could cause this appearance.
Several small nodules in the right lung may well be inflammatory.
Other etiologies such as vasculitis or tumor are considered less
likely.  This lesion is relatively peripheral and occasionally a
bronchopleural fistula with loculated pleural abscess can have a
similar appearance.

## 2021-09-20 ENCOUNTER — Other Ambulatory Visit: Payer: Self-pay

## 2021-09-20 ENCOUNTER — Ambulatory Visit
Admission: EM | Admit: 2021-09-20 | Discharge: 2021-09-20 | Disposition: A | Discharge status: Home / Self Care | Payer: 59 | Attending: Family Medicine | Admitting: Family Medicine

## 2021-09-20 DIAGNOSIS — R590 Localized enlarged lymph nodes: Secondary | ICD-10-CM

## 2021-09-20 DIAGNOSIS — B349 Viral infection, unspecified: Secondary | ICD-10-CM

## 2021-09-20 NOTE — ED Triage Notes (Signed)
Pt reports right sided jaw pain and swollen lymph node in the right side of neck on and off x 2 days; chills x 1 day. Pt took 500 mg amoxicillin (leftover) last night an today.

## 2021-09-20 NOTE — Discharge Instructions (Addendum)
If not allergic, you may use over the counter ibuprofen or acetaminophen as needed.

## 2021-09-23 NOTE — ED Provider Notes (Signed)
°  [facility]   [id number] 09/20/21 Arrival Time: 1627  ASSESSMENT & PLAN:  1. Viral illness   2. Cervical lymphadenopathy    Discussed typical duration of viral illnesses. May f/u here if lymphadenopathy does not improve once he begins feeling better. OTC symptom care as needed.   Follow-up Information     [facility] at [facility].   Specialty: Urgent Care Why: If worsening or failing to improve as anticipated. Contact information: [address] [place] [address] [phone]                Reviewed expectations re: course of current medical issues. Questions answered. Outlined signs and symptoms indicating need for more acute intervention. Understanding verbalized. After Visit Summary given.   SUBJECTIVE: History from: Patient. Gregory Flowers is a 31 y.o. male. Reports: chills, nasal congestion; R-sided "lump" on neck. Symptoms started approx 2 d ago. Took a dose of amoxicillin last night; unsure if helped. Denies: headache. Normal PO intake without n/v/d.  OBJECTIVE:  Vitals:   09/20/21 1727  BP: 137/78  Pulse: 88  Resp: 16  Temp: 98.6 F (37 C)  TempSrc: Oral  SpO2: 98%    General appearance: alert; no distress Eyes: PERRLA; EOMI; conjunctiva normal HENT: [place]; AT; with nasal congestion Neck: supple with small R-sided cervical enlarged sub-cm lymph node without overlying erythema Lungs: speaks full sentences without difficulty; unlabored Extremities: no edema Skin: warm and dry Neurologic: normal gait Psychological: alert and cooperative; normal mood and affect    Allergies  Allergen Reactions   Amoxicillin-Pot Clavulanate Nausea And Vomiting and Rash   Amoxicillin    Invanz [Ertapenem Sodium]     Possible cause of diffuse rash, neutropenia, fever   Vancomycin     Possible cause of diffuse rash, neutropenia, fever    Past Medical History:  Diagnosis Date   Lung abscess (HCC)     Substance abuse ([place])    [place] DAILY   Social History   Socioeconomic History   Marital status: Single    Spouse name: Not on file   Number of children: Not on file   Years of education: Not on file   Highest education level: Not on file  Occupational History   Not on file  Tobacco Use   Smoking status: Former   Smokeless tobacco: Never  Substance and Sexual Activity   Alcohol use: Yes   Drug use: Yes    Types: Marijuana   Sexual activity: Yes    Partners: Female  Other Topics Concern   Not on file  Social History Narrative   Not on file   Social Determinants of Health   Financial Resource Strain: Not on file  Food Insecurity: Not on file  Transportation Needs: Not on file  Physical Activity: Not on file  Stress: Not on file  Social Connections: Not on file  Intimate Partner Violence: Not on file   History reviewed. No pertinent family history. Past Surgical History:  Procedure Laterality Date   PICC LINE PLACEMENT     DR. Shirline Frees, MD 09/23/21 1517
